# Patient Record
Sex: Male | Born: 1980 | Hispanic: Yes | Marital: Single | Smoking: Current some day smoker
Health system: Southern US, Community
[De-identification: ages and names within clinical notes are randomized; demographics above are authoritative.]

## PROBLEM LIST (undated history)

## (undated) DIAGNOSIS — I10 Essential (primary) hypertension: Secondary | ICD-10-CM

---

## 2014-10-03 ENCOUNTER — Encounter (HOSPITAL_COMMUNITY): Payer: Self-pay | Admitting: Emergency Medicine

## 2014-10-03 ENCOUNTER — Emergency Department (HOSPITAL_COMMUNITY): Payer: Worker's Compensation

## 2014-10-03 ENCOUNTER — Emergency Department (HOSPITAL_COMMUNITY)
Admission: EM | Admit: 2014-10-03 | Discharge: 2014-10-03 | Disposition: A | Discharge status: Home / Self Care | Payer: Worker's Compensation | Attending: Emergency Medicine | Admitting: Emergency Medicine

## 2014-10-03 DIAGNOSIS — W312XXA Contact with powered woodworking and forming machines, initial encounter: Secondary | ICD-10-CM | POA: Insufficient documentation

## 2014-10-03 DIAGNOSIS — S61210A Laceration without foreign body of right index finger without damage to nail, initial encounter: Secondary | ICD-10-CM | POA: Diagnosis not present

## 2014-10-03 DIAGNOSIS — Z79899 Other long term (current) drug therapy: Secondary | ICD-10-CM | POA: Insufficient documentation

## 2014-10-03 DIAGNOSIS — Y9389 Activity, other specified: Secondary | ICD-10-CM | POA: Insufficient documentation

## 2014-10-03 DIAGNOSIS — Y998 Other external cause status: Secondary | ICD-10-CM | POA: Diagnosis not present

## 2014-10-03 DIAGNOSIS — IMO0002 Reserved for concepts with insufficient information to code with codable children: Secondary | ICD-10-CM

## 2014-10-03 DIAGNOSIS — S6991XA Unspecified injury of right wrist, hand and finger(s), initial encounter: Secondary | ICD-10-CM | POA: Diagnosis present

## 2014-10-03 DIAGNOSIS — S62639B Displaced fracture of distal phalanx of unspecified finger, initial encounter for open fracture: Secondary | ICD-10-CM

## 2014-10-03 DIAGNOSIS — S62630B Displaced fracture of distal phalanx of right index finger, initial encounter for open fracture: Secondary | ICD-10-CM | POA: Insufficient documentation

## 2014-10-03 DIAGNOSIS — Y9289 Other specified places as the place of occurrence of the external cause: Secondary | ICD-10-CM | POA: Insufficient documentation

## 2014-10-03 MED ORDER — LIDOCAINE HCL 2 % IJ SOLN
20.0000 mL | Freq: Once | INTRAMUSCULAR | Status: AC
  Administered 2014-10-03: 400 mg
  Filled 2014-10-03: qty 20

## 2014-10-03 MED ORDER — CEFUROXIME SODIUM 1.5 G IJ SOLR
1.5000 g | Freq: Three times a day (TID) | INTRAMUSCULAR | Status: DC
Start: 2014-10-03 — End: 2014-10-03

## 2014-10-03 MED ORDER — CEFUROXIME SODIUM 1.5 G IJ SOLR
1.5000 g | Freq: Three times a day (TID) | INTRAMUSCULAR | Status: DC
  Filled 2014-10-03 (×5): qty 1.5

## 2014-10-03 MED ORDER — SULFAMETHOXAZOLE-TRIMETHOPRIM 800-160 MG PO TABS: 1.0000 | ORAL_TABLET | Freq: Two times a day (BID) | ORAL | Status: AC

## 2014-10-03 MED ORDER — OXYCODONE-ACETAMINOPHEN 5-325 MG PO TABS: 1.0000 | ORAL_TABLET | ORAL | Status: DC | PRN

## 2014-10-03 MED ORDER — CEFUROXIME SODIUM 750 MG IJ SOLR
1.5000 g | Freq: Three times a day (TID) | INTRAMUSCULAR | Status: DC
  Administered 2014-10-03: 1.5 g via INTRAMUSCULAR
  Filled 2014-10-03 (×3): qty 1500

## 2014-10-03 NOTE — ED Notes (Signed)
Called pharmacy again and asked for medication.

## 2014-10-03 NOTE — ED Notes (Signed)
Had spoken with pharmacy earlier and they were to send the antibiotic at that time.  Still have not received.  Messaged them for the medication.

## 2014-10-03 NOTE — Discharge Instructions (Signed)
Cuidados del yeso o la frula (Cast or Splint Care) El yeso y las frulas sostienen los miembros lesionados y evitan que los huesos se muevan hasta que se curen. Es importante que cuide el yeso o la frula cuando se encuentre en su casa.  INSTRUCCIONES PARA EL CUIDADO EN EL HOGAR  Mantenga el yeso o la frula al descubierto durante el tiempo de secado. Puede tardar Lyndal Pulley 24 y 2 horas para secarse si est hecho de yeso. La fibra de vidrio se seca en menos de 1 hora.  No apoye el yeso sobre nada que sea ms duro que una almohada durante 24 horas.  No aplique peso sobre el miembro lesionado ni haga presin sobre el yeso hasta que el mdico lo autorice.  Mantenga el yeso o la frula secos. Al mojarse pueden perder la forma y podra ocurrir que no soporten el [place]. Un yeso mojado que ha perdido su forma puede presionar de Geographical information systems officer peligrosa en la piel al secarse. Adems, la piel mojada podra infectarse.  Cubra el yeso o la frula con una bolsa plstica cuando tome un bao o cuando salga al exterior en das de lluvia o nieve. Si el yeso est colocado sobre el tronco, deber baarse pasando una esponja por el cuerpo, hasta que se lo retiren.  Si el yeso se moja, squelo con una toalla o con un secador de cabello slo en posicin de aire fro.  Mantenga el yeso o la frula limpios. Si el yeso se ensucia, puede limpiarlo con un pao hmedo.  No coloque objetos extraos duros o blandos debajo del yeso o cabestrillo, como algodn, papel higinico, locin o talco.  No se rasque la piel por debajo del molde con ningn objeto. Podra quedar adherido al yeso. Adems, el rascado puede causar una infeccin. Si siente picazn, use un secador de cabello con aire fro [employer] zona que pica para [employer].  No recorte ni quite el relleno acolchado que se encuentra debajo del yeso.  Ejercite todas las articulaciones que no estn inmovilizadas por el yeso o frula. Por ejemplo, si tiene un yeso  largo de pierna, ejercite la articulacin de la cadera y los dedos de los pies. Si tiene un brazo [employer] o entablillado, ejercite el hombro, el codo, el pulgar y los dedos de la [place].  Eleve el brazo o la pierna sobre 1  2 almohadas durante los primeros 3 das para disminuir la hinchazn y Conservation officer, historic buildings.Es mejor si puede elevar cmodamente el yeso para que quede ms [place] del nivel del corazn. SOLICITE ATENCIN MDICA SI:   El yeso o la frula se quiebran.  Siente que el yeso o la frula estn muy apretados o muy flojos.  Tiene una picazn insoportable debajo del yeso.  El yeso se moja o tiene una zona blanda.  Siente un feo [employer] proviene del interior del [place].  Algn objeto se queda atascado bajo el yeso.  La piel que rodea el yeso enrojece o se vuelve sensible.  Siente un dolor nuevo o el dolor que senta empeora luego de la aplicacin del yeso. SOLICITE ATENCIN MDICA DE INMEDIATO SI:   Observa un lquido que sale por el yeso.  No puede mover el dedo lesionado.  Los dedos le cambian de color (blancos o azules), siente fro, Social research officer, government o por fuera del yeso los dedos estn muy inflamados.  Siente hormigueo o adormecimiento alrededor de la zona de la lesin.  Siente un dolor o presin intensos debajo del yeso.  Presenta dificultad para respirar o Company secretaryle falta el aire.  Siente dolor en el pecho. Document Released: 03/14/2005 Document Revised: 01/02/2013 [facility] Patient Information 2015 [employer], [place]. This information is not intended to replace advice given to you by your health care provider. Make sure you discuss any questions you have with your health care provider.  Please read attached information, monitor for signs of infection. Return immediately if any present. Please follow-up with hand surgeon on Monday for further evaluation and management. Sutures need to be removed in 7 days.

## 2014-10-03 NOTE — ED Notes (Signed)
Patient states he cut his R index finger with a skill saw approximately 20 minutes ago.   Patient states he has had no loss of sensation, can move fingers as directed.   Bleeding controlled at this time.

## 2014-10-03 NOTE — ED Provider Notes (Signed)
CSN: [id number]     Arrival date & time 10/03/14  4098 History  This chart was scribed for Burna Forts, PA-C, working with Bethann Berkshire, MD by Elon Spanner, ED Scribe. This patient was seen in room TR06C/TR06C and the patient's care was started at 9:24 AM.   Chief Complaint  Patient presents with  . Finger Injury   The history is provided by the patient. No language interpreter was used.   HPI Comments: Jaser Fullen is a 34 y.o. male who presents to the Emergency Department complaining of a right hand pointer finger injury onset 1 hour ago.  The patient's coworker is acting as a Nurse, learning disability at his request and reports the patient was using a skill saw at work, when it rebounded and cut his finger.  Patient denies history of DM.  Patient has no surgical history; last meal 7:30 am today.  Tetanus UTD.  NKA. Full active range of motion of the finger no other injuries noted.   History reviewed. No pertinent past medical history. History reviewed. No pertinent past surgical history. No family history on file. History  Substance Use Topics  . Smoking status: Never Smoker   . Smokeless tobacco: Not on file  . Alcohol Use: Yes    Review of Systems  All other systems reviewed and are negative.   Allergies  Review of patient's allergies indicates no known allergies.  Home Medications   Prior to Admission medications   Medication Sig Start Date End Date Taking? Authorizing Provider  lisinopril (PRINIVIL,ZESTRIL) 20 MG tablet Take 20 mg by mouth daily.   Yes Historical Provider, MD  oxyCODONE-acetaminophen (PERCOCET/ROXICET) 5-325 MG per tablet Take 1 tablet by mouth every 4 (four) hours as needed for severe pain. 10/03/14   Eyvonne Mechanic, PA-C  sulfamethoxazole-trimethoprim (BACTRIM DS,SEPTRA DS) 800-160 MG per tablet Take 1 tablet by mouth 2 (two) times daily. 10/03/14 10/10/14  Treesa Mccully, PA-C   BP 136/81 mmHg  Pulse 80  Temp(Src) 98.4 F (36.9 C) (Oral)  Resp 16  SpO2 100%    Physical Exam  Constitutional: He is oriented to person, place, and time. He appears well-developed and well-nourished. No distress.  HENT:  Head: Normocephalic and atraumatic.  Eyes: Conjunctivae and EOM are normal.  Neck: Neck supple. No tracheal deviation present.  Cardiovascular: Normal rate.   Pulmonary/Chest: Effort normal. No respiratory distress.  Musculoskeletal: Normal range of motion.  Linear laceration approximately a centimeter long to the distal phalanx on the right second digit. No observable bone or tendon on physical exam no foreign bodies noted bleeding controlled. Resisted flexion of the DIP PIP intact cap refill less than 3 seconds sensation intact  Neurological: He is alert and oriented to person, place, and time.  Skin: Skin is warm and dry.  Psychiatric: He has a normal mood and affect. His behavior is normal.  Nursing note and vitals reviewed.   ED Course  Procedures (including critical care time)  DIAGNOSTIC STUDIES: Oxygen Saturation is 96% on RA, normal by my interpretation.    COORDINATION OF CARE:  9:50 AM Discussed treatment plan with patient at bedside.  Patient acknowledges and agrees with plan.    LACERATION REPAIR PROCEDURE NOTE The patient's identification was confirmed and consent was obtained. This procedure was performed by Burna Forts, PA-C at 11:44 AM. Site: Right volar aspect second digit Sterile procedures observed yes Anesthetic used (type and amt): 2% lidocaine Suture type/size: 5-0 Prolene Length: 1 cm # of Sutures: 3 Technique: Simple interrupted Complexity simple  Antibx ointment applied acid trace and Tetanus UTD or ordered up-to-date Site anesthetized, irrigated with copious NS, explored without evidence of foreign body, wound well approximated, site covered with dry, sterile dressing.  Patient tolerated procedure well without complications. Instructions for care discussed verbally and patient provided with additional  written instructions for homecare and f/u.  Labs Review Labs Reviewed - No data to display  Imaging Review Dg Finger Index Right  10/03/2014   CLINICAL DATA:  Skil saw laceration today  EXAM: RIGHT INDEX FINGER 2+V  COMPARISON:  None.  FINDINGS: Three views of the right second finger submitted. There is small avulsion fracture distal phalanx second finger with associated soft tissue injury. Open fracture cannot be excluded. Clinical correlation is necessary.  IMPRESSION: Small avulsion fracture distal phalanx right second finger with associated soft tissue injury. Open fracture cannot be excluded.   Electronically Signed   By: Natasha MeadLiviu  Pop M.D.   On: 10/03/2014 09:35     EKG Interpretation None      MDM   Final diagnoses:  Laceration  Fracture of distal phalanx of finger, open, initial encounter    Labs:  Imaging: DG Finger Index Right- I personally reviewed the films and agree with the radiologist  interpretation of distal phalanx right second finger avulsion fracture  Therapeutics: 2% Lidocaine; Cefuroxime  Consult: 10:46 AM Spoke with Dr. Ronie SpiesWeingold's nurse.  Assessment/Plan:  Patient presents with a fracture of the distal phalanx, laceration to the finger, this likely represents open fracture although the bone was not visible on my physical exam. Dr. Mina MarbleWeingold personally called me, I informed him of the open fracture, he instructed me to suture together have him follow-up in the office Monday morning. Patient placed on prophylactic antibiotics, given strict return precautions. Patient verbalizes understanding and agreement. He was instructed to have sutures removed in 7 days, he assured us follow-up with Dr. Mina MarbleWeingold. Patient was treated here in the ED with IM antibiotics listed above, suture was closed.  Discharge Meds: Bactrim, Percocet  I personally performed the services described in this documentation, which was scribed in my presence. The recorded information has been reviewed  and is accurate.    Eyvonne MechanicJeffrey Jalena Vanderlinden, PA-C 10/03/14 1719  Bethann BerkshireJoseph Zammit, MD 10/04/14 [phone]

## 2016-09-26 ENCOUNTER — Emergency Department (HOSPITAL_COMMUNITY): Payer: Self-pay

## 2016-09-26 ENCOUNTER — Encounter (HOSPITAL_COMMUNITY): Payer: Self-pay | Admitting: Emergency Medicine

## 2016-09-26 DIAGNOSIS — R1032 Left lower quadrant pain: Secondary | ICD-10-CM | POA: Insufficient documentation

## 2016-09-26 DIAGNOSIS — E860 Dehydration: Secondary | ICD-10-CM | POA: Insufficient documentation

## 2016-09-26 DIAGNOSIS — I10 Essential (primary) hypertension: Secondary | ICD-10-CM | POA: Insufficient documentation

## 2016-09-26 DIAGNOSIS — Z79899 Other long term (current) drug therapy: Secondary | ICD-10-CM | POA: Insufficient documentation

## 2016-09-26 DIAGNOSIS — N179 Acute kidney failure, unspecified: Principal | ICD-10-CM | POA: Insufficient documentation

## 2016-09-26 DIAGNOSIS — M109 Gout, unspecified: Secondary | ICD-10-CM | POA: Insufficient documentation

## 2016-09-26 DIAGNOSIS — M546 Pain in thoracic spine: Secondary | ICD-10-CM | POA: Insufficient documentation

## 2016-09-26 LAB — BASIC METABOLIC PANEL
Anion gap: 13 (ref 5–15)
BUN: 20 mg/dL (ref 6–20)
CHLORIDE: 103 mmol/L (ref 101–111)
CO2: 23 mmol/L (ref 22–32)
Calcium: 9.1 mg/dL (ref 8.9–10.3)
Creatinine, Ser: 1.73 mg/dL — ABNORMAL HIGH (ref 0.61–1.24)
GFR calc Af Amer: 57 mL/min — ABNORMAL LOW (ref >60)
GFR calc non Af Amer: 49 mL/min — ABNORMAL LOW (ref >60)
GLUCOSE: 106 mg/dL — AB (ref 65–99)
POTASSIUM: 4.2 mmol/L (ref 3.5–5.1)
Sodium: 139 mmol/L (ref 135–145)

## 2016-09-26 LAB — CBC
HEMATOCRIT: 43.3 % (ref 39.0–52.0)
Hemoglobin: 14.3 g/dL (ref 13.0–17.0)
MCH: 30.5 pg (ref 26.0–34.0)
MCHC: 33 g/dL (ref 30.0–36.0)
MCV: 92.3 fL (ref 78.0–100.0)
Platelets: 198 10*3/uL (ref 150–400)
RBC: 4.69 MIL/uL (ref 4.22–5.81)
RDW: 13.6 % (ref 11.5–15.5)
WBC: 8 10*3/uL (ref 4.0–10.5)

## 2016-09-26 LAB — I-STAT TROPONIN, ED: Troponin i, poc: 0 ng/mL (ref 0.00–0.08)

## 2016-09-26 LAB — LIPASE, BLOOD: LIPASE: 30 U/L (ref 11–51)

## 2016-09-26 NOTE — ED Triage Notes (Signed)
Pt. presents with multiple complaints: mid back pain onset this afternoon with upper abdominal ain  And left lower chest pain , denies SOB , no nausea or diaphoresis . Pain increases when sitting .

## 2016-09-27 ENCOUNTER — Encounter (HOSPITAL_COMMUNITY): Payer: Self-pay | Admitting: Internal Medicine

## 2016-09-27 ENCOUNTER — Observation Stay (HOSPITAL_COMMUNITY)
Admission: EM | Admit: 2016-09-27 | Discharge: 2016-09-28 | Disposition: A | Discharge status: Home / Self Care | Payer: Self-pay | Attending: Family Medicine | Admitting: Family Medicine

## 2016-09-27 ENCOUNTER — Emergency Department (HOSPITAL_COMMUNITY): Payer: Self-pay

## 2016-09-27 DIAGNOSIS — M109 Gout, unspecified: Secondary | ICD-10-CM | POA: Diagnosis not present

## 2016-09-27 DIAGNOSIS — R109 Unspecified abdominal pain: Secondary | ICD-10-CM | POA: Diagnosis present

## 2016-09-27 DIAGNOSIS — N179 Acute kidney failure, unspecified: Secondary | ICD-10-CM | POA: Diagnosis present

## 2016-09-27 DIAGNOSIS — Z8739 Personal history of other diseases of the musculoskeletal system and connective tissue: Secondary | ICD-10-CM

## 2016-09-27 DIAGNOSIS — I10 Essential (primary) hypertension: Secondary | ICD-10-CM | POA: Diagnosis present

## 2016-09-27 HISTORY — DX: Essential (primary) hypertension: I10

## 2016-09-27 LAB — URINALYSIS, ROUTINE W REFLEX MICROSCOPIC
BILIRUBIN URINE: NEGATIVE
Glucose, UA: NEGATIVE mg/dL
Ketones, ur: NEGATIVE mg/dL
LEUKOCYTES UA: NEGATIVE
Nitrite: NEGATIVE
PH: 5 (ref 5.0–8.0)
Protein, ur: 30 mg/dL — AB
SPECIFIC GRAVITY, URINE: 1.005 (ref 1.005–1.030)

## 2016-09-27 LAB — HEPATIC FUNCTION PANEL
ALT: 38 U/L (ref 17–63)
AST: 32 U/L (ref 15–41)
Albumin: 3.8 g/dL (ref 3.5–5.0)
Alkaline Phosphatase: 83 U/L (ref 38–126)
BILIRUBIN DIRECT: 0.1 mg/dL (ref 0.1–0.5)
BILIRUBIN INDIRECT: 0.7 mg/dL (ref 0.3–0.9)
TOTAL PROTEIN: 7.3 g/dL (ref 6.5–8.1)
Total Bilirubin: 0.8 mg/dL (ref 0.3–1.2)

## 2016-09-27 LAB — RAPID URINE DRUG SCREEN, HOSP PERFORMED
Amphetamines: NOT DETECTED
BARBITURATES: NOT DETECTED
Benzodiazepines: NOT DETECTED
Cocaine: NOT DETECTED
Opiates: NOT DETECTED
Tetrahydrocannabinol: NOT DETECTED

## 2016-09-27 LAB — BASIC METABOLIC PANEL
Anion gap: 9 (ref 5–15)
BUN: 20 mg/dL (ref 6–20)
CALCIUM: 9.4 mg/dL (ref 8.9–10.3)
CO2: 25 mmol/L (ref 22–32)
Chloride: 103 mmol/L (ref 101–111)
Creatinine, Ser: 1.82 mg/dL — ABNORMAL HIGH (ref 0.61–1.24)
GFR calc Af Amer: 54 mL/min — ABNORMAL LOW (ref >60)
GFR, EST NON AFRICAN AMERICAN: 46 mL/min — AB (ref >60)
GLUCOSE: 101 mg/dL — AB (ref 65–99)
Potassium: 3.8 mmol/L (ref 3.5–5.1)
Sodium: 137 mmol/L (ref 135–145)

## 2016-09-27 LAB — POCT I-STAT TROPONIN I: Troponin i, poc: 0 ng/mL (ref 0.00–0.08)

## 2016-09-27 LAB — HIV ANTIBODY (ROUTINE TESTING W REFLEX): HIV SCREEN 4TH GENERATION: NONREACTIVE

## 2016-09-27 MED ORDER — ONDANSETRON HCL 4 MG/2ML IJ SOLN: 4.0000 mg | Freq: Four times a day (QID) | INTRAMUSCULAR | Status: DC | PRN

## 2016-09-27 MED ORDER — ACETAMINOPHEN 325 MG PO TABS: 650.0000 mg | ORAL_TABLET | Freq: Four times a day (QID) | ORAL | Status: DC | PRN

## 2016-09-27 MED ORDER — DEXTROSE 5 % IV SOLN: 1.0000 g | INTRAVENOUS | Status: DC

## 2016-09-27 MED ORDER — ONDANSETRON HCL 4 MG PO TABS: 4.0000 mg | ORAL_TABLET | Freq: Four times a day (QID) | ORAL | Status: DC | PRN

## 2016-09-27 MED ORDER — ACETAMINOPHEN 650 MG RE SUPP: 650.0000 mg | Freq: Four times a day (QID) | RECTAL | Status: DC | PRN

## 2016-09-27 MED ORDER — SODIUM CHLORIDE 0.9 % IV SOLN
Freq: Once | INTRAVENOUS | Status: AC
  Administered 2016-09-27: 05:00:00 via INTRAVENOUS

## 2016-09-27 MED ORDER — SODIUM CHLORIDE 0.9 % IV BOLUS (SEPSIS)
1000.0000 mL | Freq: Once | INTRAVENOUS | Status: AC
  Administered 2016-09-27: 1000 mL via INTRAVENOUS

## 2016-09-27 MED ORDER — ONDANSETRON HCL 4 MG/2ML IJ SOLN
4.0000 mg | Freq: Once | INTRAMUSCULAR | Status: AC
  Administered 2016-09-27: 4 mg via INTRAVENOUS
  Filled 2016-09-27: qty 2

## 2016-09-27 MED ORDER — SODIUM CHLORIDE 0.9 % IV SOLN
INTRAVENOUS | Status: AC
  Administered 2016-09-27 (×2): via INTRAVENOUS

## 2016-09-27 MED ORDER — AMLODIPINE BESYLATE 5 MG PO TABS
5.0000 mg | ORAL_TABLET | Freq: Every day | ORAL | Status: DC
  Administered 2016-09-27 – 2016-09-28 (×2): 5 mg via ORAL
  Filled 2016-09-27 (×2): qty 1

## 2016-09-27 MED ORDER — HYDRALAZINE HCL 20 MG/ML IJ SOLN: 10.0000 mg | INTRAMUSCULAR | Status: DC | PRN

## 2016-09-27 MED ORDER — DEXTROSE 5 % IV SOLN
1.0000 g | Freq: Once | INTRAVENOUS | Status: AC
  Administered 2016-09-27: 1 g via INTRAVENOUS
  Filled 2016-09-27: qty 10

## 2016-09-27 MED ORDER — MORPHINE SULFATE (PF) 4 MG/ML IV SOLN
4.0000 mg | Freq: Once | INTRAVENOUS | Status: AC
  Administered 2016-09-27: 4 mg via INTRAVENOUS
  Filled 2016-09-27: qty 1

## 2016-09-27 MED ORDER — MORPHINE SULFATE (PF) 4 MG/ML IV SOLN
1.0000 mg | INTRAVENOUS | Status: DC | PRN
  Administered 2016-09-27 – 2016-09-28 (×3): 1 mg via INTRAVENOUS
  Filled 2016-09-27 (×3): qty 1

## 2016-09-27 NOTE — Progress Notes (Signed)
New Admission Note:   Arrival Method: Bed Mental Orientation: A&O X4 Telemetry: Initiated Assessment: Completed Skin: WDL IV: Clean, Dry, Intact, Infusin Pain: Denies Safety Measures: Safety Fall Prevention Plan has been given, discussed and signed Admission: Completed Unit Orientation: Patient has been orientated to the room, unit and staff.  Family: At bedside  Orders have been reviewed and implemented. Will continue to monitor the patient. Call light has been placed within reach and bed alarm has been activated.    Britt BologneseAnisha Mabe RN, BSN

## 2016-09-27 NOTE — H&P (Signed)
History and Physical    Walter Murray JXB:[id number] DOB: 1980/05/24 DOA: 09/27/2016  PCP: Patient, No Pcp Per  Patient coming from: Home.  Engineer, structural used.  Chief Complaint: Left flank pain.  HPI: Dymond Spreen is a 36 y.o. male with history of hypertension, gout presents to the ER with sudden onset of left flank pain. Patient's symptoms started last evening. Denies any nausea vomiting or diarrhea. Pain was from the left flank going to his left groin. Pain was severe in nature.   ED Course: In the ER patient's UA shows some bacteria. Creatinine was 1.7. CT renal study shows no obstruction or renal stones but shows bilateral perinephric stranding more on the left side. Since patient still has pain patient has been admitted for further observation and hydration and was placed on antibiotics for possible pyelonephritis.  Review of Systems: As per HPI, rest all negative.   Past Medical History:  Diagnosis Date  . Hypertension     History reviewed. No pertinent surgical history.   reports that he has been smoking.  He has never used smokeless tobacco. He reports that he drinks alcohol. He reports that he does not use drugs.  No Known Allergies  Family History  Problem Relation Age of Onset  . Diabetes Mellitus II Mother     Prior to Admission medications   Medication Sig Start Date End Date Taking? Authorizing Provider  losartan (COZAAR) 100 MG tablet Take 100 mg by mouth daily.   Yes [provider]  oxyCODONE-acetaminophen (PERCOCET/ROXICET) 5-325 MG per tablet Take 1 tablet by mouth every 4 (four) hours as needed for severe pain. Patient not taking: Reported on 09/27/2016 10/03/14   Eyvonne Mechanic, PA-C    Physical Exam: Vitals:   09/27/16 0315 09/27/16 0400 09/27/16 0415 09/27/16 0430  BP: (!) 144/91 (!) 150/96 (!) 156/93 (!) 160/91  Pulse: 73 70 87 89  Resp: (!) 23 (!) 27 (!) 21 (!) 22  Temp:      TempSrc:      SpO2: 100% 98% 99% 100%       Constitutional: Moderately built and nourished. Vitals:   09/27/16 0315 09/27/16 0400 09/27/16 0415 09/27/16 0430  BP: (!) 144/91 (!) 150/96 (!) 156/93 (!) 160/91  Pulse: 73 70 87 89  Resp: (!) 23 (!) 27 (!) 21 (!) 22  Temp:      TempSrc:      SpO2: 100% 98% 99% 100%   Eyes: Anicteric no pallor. ENMT: No discharge from the ears eyes nose and mouth. Neck: No mass felt. No neck rigidity. Respiratory: No rhonchi or crepitations. Cardiovascular: S1 and S2 heard no murmurs appreciated. Abdomen: Soft nontender bowel sounds present. Musculoskeletal: No edema. No joint effusion. Skin: No rash. Skin appears warm. Neurologic: Alert awake oriented to time place and person. Moves all extremities. Psychiatric: Appears normal. Normal affect.   Labs on Admission: I have personally reviewed following labs and imaging studies  CBC:  Recent Labs Lab 09/26/16 2223  WBC 8.0  HGB 14.3  HCT 43.3  MCV 92.3  PLT 198   Basic Metabolic Panel:  Recent Labs Lab 09/26/16 2223  NA 139  K 4.2  CL 103  CO2 23  GLUCOSE 106*  BUN 20  CREATININE 1.73*  CALCIUM 9.1   GFR: CrCl cannot be calculated (Unknown ideal weight.). Liver Function Tests: No results for input(s): AST, ALT, ALKPHOS, BILITOT, PROT, ALBUMIN in the last 168 hours.  Recent Labs Lab 09/26/16 2223  LIPASE 30  No results for input(s): AMMONIA in the last 168 hours. Coagulation Profile: No results for input(s): INR, PROTIME in the last 168 hours. Cardiac Enzymes: No results for input(s): CKTOTAL, CKMB, CKMBINDEX, TROPONINI in the last 168 hours. BNP (last 3 results) No results for input(s): PROBNP in the last 8760 hours. HbA1C: No results for input(s): HGBA1C in the last 72 hours. CBG: No results for input(s): GLUCAP in the last 168 hours. Lipid Profile: No results for input(s): CHOL, HDL, LDLCALC, TRIG, CHOLHDL, LDLDIRECT in the last 72 hours. Thyroid Function Tests: No results for input(s): TSH, T4TOTAL,  FREET4, T3FREE, THYROIDAB in the last 72 hours. Anemia Panel: No results for input(s): VITAMINB12, FOLATE, FERRITIN, TIBC, IRON, RETICCTPCT in the last 72 hours. Urine analysis:    Component Value Date/Time   COLORURINE STRAW (A) 09/27/2016 0214   APPEARANCEUR CLEAR 09/27/2016 0214   LABSPEC 1.005 09/27/2016 0214   PHURINE 5.0 09/27/2016 0214   GLUCOSEU NEGATIVE 09/27/2016 0214   HGBUR SMALL (A) 09/27/2016 0214   BILIRUBINUR NEGATIVE 09/27/2016 0214   KETONESUR NEGATIVE 09/27/2016 0214   PROTEINUR 30 (A) 09/27/2016 0214   NITRITE NEGATIVE 09/27/2016 0214   LEUKOCYTESUR NEGATIVE 09/27/2016 0214   Sepsis Labs: @LABRCNTIP (procalcitonin:4,lacticidven:4) )No results found for this or any previous visit (from the past 240 hour(s)).   Radiological Exams on Admission: Dg Chest 2 View  Result Date: 09/26/2016 CLINICAL DATA:  Mid back pain onset this afternoon with left lower chest pain. EXAM: CHEST  2 VIEW COMPARISON:  None. FINDINGS: The heart size and mediastinal contours are within normal limits. Lungs are clear. Low lung volumes noted bilaterally. No pneumothorax, pulmonary consolidation or effusion. No acute nor suspicious osseous abnormalities. IMPRESSION: No active cardiopulmonary disease. Electronically Signed   By: Tollie Ethavid  Kwon M.D.   On: 09/26/2016 23:47   Ct Renal Stone Study  Result Date: 09/27/2016 CLINICAL DATA:  Left flank pain for 2 days. EXAM: CT ABDOMEN AND PELVIS WITHOUT CONTRAST TECHNIQUE: Multidetector CT imaging of the abdomen and pelvis was performed following the standard protocol without IV contrast. COMPARISON:  None. FINDINGS: Lower chest: Eventration of right hemidiaphragm. No consolidation. No pleural effusion. Hepatobiliary: The liver is enlarged spanning 23 cm cranial caudal diffusely decreased density. No evidence of focal hepatic lesion. Gallbladder minimally distended, no calcified stone. No biliary dilatation. Pancreas: No ductal dilatation or inflammation. Spleen:  Normal in size without focal abnormality. Adrenals/Urinary Tract: Normal adrenal glands. Moderate bilateral perinephric edema, left greater than right. No frank hydronephrosis or urolithiasis. Urinary bladder is minimally distended and not well evaluated. No bladder stones. Stomach/Bowel: Stomach is decompressed. No small bowel obstruction or inflammation. Appendix not confidently visualized. No pericecal or right lower quadrant inflammation. Small to moderate stool burden without colonic wall thickening. Vascular/Lymphatic: Mild aortic atherosclerosis. No aneurysm. No adenopathy. Reproductive: Prostate is unremarkable. Other: No free air, free fluid, or intra-abdominal fluid collection. Fat in the left inguinal canal. Small fat containing umbilical hernia. 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 There are no acute or suspicious osseous abnormalities. IMPRESSION: 1. No renal stones or obstructive uropathy. 2. Moderate bilateral perinephric edema, left greater than right. This is nonspecific, can be seen in the setting of urinary tract infection, acute or chronic renal disease. 3. Hepatomegaly and hepatic steatosis. 4. Mild aortic atherosclerosis. Electronically Signed   By: Rubye OaksMelanie  Ehinger M.D.   On: 09/27/2016 02:49    EKG: Independently reviewed. Normal sinus rhythm.  Assessment/Plan Principal Problem:   ARF (acute renal failure) (HCC) Active Problems:   Essential hypertension  Left flank pain   History of gout    1. Acute renal failure with left flank pain - no old labs to compare. Among the differentials include possible pyelonephritis. Patient has been placed on ceftriaxone and follow urine cultures continue with hydration and hold Cozaar. Follow intake output metabolic panel. 2. Hypertension - since patient has possible acute renal failure will hold off Cozaar and keep patient on when necessary IV hydralazine. Follow metabolic panel.  3. History of GERD presently no signs of  exacerbation. 4. Tobacco abuse - tobacco cessation counseling requested.   DVT prophylaxis: SCDs. Code Status: Full code.  Family Communication: Discussed with patient.  Disposition Plan: Home.  Consults called: None.  Admission status: Observation.    Eduard Clos MD [facility] Pager [phone].  If 7PM-7AM, please contact night-coverage [email or web address] Password [email or web address]  09/27/2016, 5:48 AM

## 2016-09-27 NOTE — H&P (Signed)
PROGRESS NOTE    Walter Murray  ZOX:[id number] DOB: 03/15/1981 DOA: 09/27/2016     Brief Narrative:    36 year old male with past medical history of hypertension on Cozaar was working in the hot weather presents with bilateral flank pain .  Assessment & Plan:   1-Acute renal failure : most likely ATN in the setting of taking ARB and dehydration , Patient is still making urine we are going to continue to hydrate him with normal saline and follow-up with BMP in the morning , avoid  nephrotoxic agents , continue to monitor him as an inpatient as his creatinine is still peaking around 1.8 , we expect the creatinine to start to trend down soon , strict I/O for now . Patient has no SIRS criteria and no UTI symptoms his urinalysis is not supportive of any UTI for these reasons I will stop the antibiotics , most likely his CAT scan findings are related to an acute renal failure.  2-Essential hypertension : patient can be started on Norvasc 5 mg daily .  DVT prophylaxis:SCD  Code Status: FULL .  Disposition Plan: Home in 24 hours.  Consultants:   None .    Antimicrobials:  None.   Subjective:  Mr. Sheerin is feeling better , flank pain has resolved,  he is denying any nausea or vomiting fevers or chills any UTI symptoms .  Objective: Vitals:   09/27/16 0430 09/27/16 0515 09/27/16 0615 09/27/16 0630  BP: (!) 160/91 (!) 143/91 135/79 121/76  Pulse: 89 78 71 70  Resp: (!) 22 (!) 23 (!) 24 (!) 23  Temp:      TempSrc:      SpO2: 100% 98% 98% 97%    Intake/Output Summary (Last 24 hours) at 09/27/16 1011 Last data filed at 09/27/16 0951  Gross per 24 hour  Intake             1530 ml  Output                0 ml  Net             1530 ml   There were no vitals filed for this visit.  Examination:  General exam: NAD. Respiratory system: Lungs were clear to auscultation bilaterally Cardiovascular system: Regular rate S1-S2 no murmurs. Gastrointestinal system: Abdomen is soft  nontender non-distended no rebound or rigidity. Central nervous system: Nonfocal  Extremities: Symmetric 5 x 5 power. Skin: No rashes, lesions or ulcers Psychiatry: Judgement and insight appear normal. Mood & affect appropriate.     Data Reviewed: I have personally reviewed following labs and imaging studies  CBC:  Recent Labs Lab 09/26/16 2223  WBC 8.0  HGB 14.3  HCT 43.3  MCV 92.3  PLT 198   Basic Metabolic Panel:  Recent Labs Lab 09/26/16 2223 09/27/16 0718  NA 139 137  K 4.2 3.8  CL 103 103  CO2 23 25  GLUCOSE 106* 101*  BUN 20 20  CREATININE 1.73* 1.82*  CALCIUM 9.1 9.4   GFR: CrCl cannot be calculated (Unknown ideal weight.). Liver Function Tests:  Recent Labs Lab 09/27/16 0718  AST 32  ALT 38  ALKPHOS 83  BILITOT 0.8  PROT 7.3  ALBUMIN 3.8    Recent Labs Lab 09/26/16 2223  LIPASE 30   No results for input(s): AMMONIA in the last 168 hours. Coagulation Profile: No results for input(s): INR, PROTIME in the last 168 hours. Cardiac Enzymes: No results for input(s): CKTOTAL, CKMB, CKMBINDEX, TROPONINI  in the last 168 hours. BNP (last 3 results) No results for input(s): PROBNP in the last 8760 hours. HbA1C: No results for input(s): HGBA1C in the last 72 hours. CBG: No results for input(s): GLUCAP in the last 168 hours. Lipid Profile: No results for input(s): CHOL, HDL, LDLCALC, TRIG, CHOLHDL, LDLDIRECT in the last 72 hours. Thyroid Function Tests: No results for input(s): TSH, T4TOTAL, FREET4, T3FREE, THYROIDAB in the last 72 hours. Anemia Panel: No results for input(s): VITAMINB12, FOLATE, FERRITIN, TIBC, IRON, RETICCTPCT in the last 72 hours. Urine analysis:    Component Value Date/Time   COLORURINE STRAW (A) 09/27/2016 0214   APPEARANCEUR CLEAR 09/27/2016 0214   LABSPEC 1.005 09/27/2016 0214   PHURINE 5.0 09/27/2016 0214   GLUCOSEU NEGATIVE 09/27/2016 0214   HGBUR SMALL (A) 09/27/2016 0214   BILIRUBINUR NEGATIVE 09/27/2016 0214    KETONESUR NEGATIVE 09/27/2016 0214   PROTEINUR 30 (A) 09/27/2016 0214   NITRITE NEGATIVE 09/27/2016 0214   LEUKOCYTESUR NEGATIVE 09/27/2016 0214   Sepsis Labs: @LABRCNTIP (procalcitonin:4,lacticidven:4)  )No results found for this or any previous visit (from the past 240 hour(s)).       Radiology Studies: Dg Chest 2 View  Result Date: 09/26/2016 CLINICAL DATA:  Mid back pain onset this afternoon with left lower chest pain. EXAM: CHEST  2 VIEW COMPARISON:  None. FINDINGS: The heart size and mediastinal contours are within normal limits. Lungs are clear. Low lung volumes noted bilaterally. No pneumothorax, pulmonary consolidation or effusion. No acute nor suspicious osseous abnormalities. IMPRESSION: No active cardiopulmonary disease. Electronically Signed   By: Tollie Ethavid  Kwon M.D.   On: 09/26/2016 23:47   Ct Renal Stone Study  Result Date: 09/27/2016 CLINICAL DATA:  Left flank pain for 2 days. EXAM: CT ABDOMEN AND PELVIS WITHOUT CONTRAST TECHNIQUE: Multidetector CT imaging of the abdomen and pelvis was performed following the standard protocol without IV contrast. COMPARISON:  None. FINDINGS: Lower chest: Eventration of right hemidiaphragm. No consolidation. No pleural effusion. Hepatobiliary: The liver is enlarged spanning 23 cm cranial caudal diffusely decreased density. No evidence of focal hepatic lesion. Gallbladder minimally distended, no calcified stone. No biliary dilatation. Pancreas: No ductal dilatation or inflammation. Spleen: Normal in size without focal abnormality. Adrenals/Urinary Tract: Normal adrenal glands. Moderate bilateral perinephric edema, left greater than right. No frank hydronephrosis or urolithiasis. Urinary bladder is minimally distended and not well evaluated. No bladder stones. Stomach/Bowel: Stomach is decompressed. No small bowel obstruction or inflammation. Appendix not confidently visualized. No pericecal or right lower quadrant inflammation. Small to moderate stool  burden without colonic wall thickening. Vascular/Lymphatic: Mild aortic atherosclerosis. No aneurysm. No adenopathy. Reproductive: Prostate is unremarkable. Other: No free air, free fluid, or intra-abdominal fluid collection. Fat in the left inguinal canal. Small fat containing umbilical hernia. 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 There are no acute or suspicious osseous abnormalities. IMPRESSION: 1. No renal stones or obstructive uropathy. 2. Moderate bilateral perinephric edema, left greater than right. This is nonspecific, can be seen in the setting of urinary tract infection, acute or chronic renal disease. 3. Hepatomegaly and hepatic steatosis. 4. Mild aortic atherosclerosis. Electronically Signed   By: Rubye OaksMelanie  Ehinger M.D.   On: 09/27/2016 02:49        Scheduled Meds: Continuous Infusions: . sodium chloride    . [START ON 09/28/2016] cefTRIAXone (ROCEPHIN)  IV       LOS: 0 days    Time spent: more than 30 minutes.    Efrain SellaEiad Janisse Ghan, MD [facility] Pager [phone]  If  7PM-7AM, please contact night-coverage [email or web address] Password [email or web address] 09/27/2016, 10:11 AM

## 2016-09-27 NOTE — ED Notes (Signed)
Patient transported to CT 

## 2016-09-27 NOTE — Progress Notes (Signed)
Pharmacy Antibiotic Note Pearlean BrownieJulian Yoshimura is a 36 y.o. male admitted on 09/27/2016 with UTI.  Pharmacy has been consulted for ceftriaxone dosing.  Plan: 1. Ceftriaxone 1 gram Iv every 24 hours  2. Will follow peripherally      Temp (24hrs), Avg:98.4 F (36.9 C), Min:98.4 F (36.9 C), Max:98.4 F (36.9 C)   Recent Labs Lab 09/26/16 2223  WBC 8.0  CREATININE 1.73*    CrCl cannot be calculated (Unknown ideal weight.).    No Known Allergies  Thank you for allowing pharmacy to be a part of this patient's care.  Sheron NightingaleJames A Soren Lazarz 09/27/2016 6:03 AM

## 2016-09-27 NOTE — ED Provider Notes (Signed)
[facility]-EMERGENCY DEPT Provider Note   CSN: [id number] Arrival date & time: 09/26/16  2200     History   Chief Complaint Chief Complaint  Patient presents with  . Back Pain  . Chest Pain  . Abdominal Pain    HPI Walter Murray is a 36 y.o. male.  L flank pain for 2 days started in back and radiated to LLQ without nausea, constipation- last normal BM 2 PM yesterday   Denies dysuria, penile discharge Dose drink heavily on weekends      Past Medical History:  Diagnosis Date  . Hypertension     There are no active problems to display for this patient.   History reviewed. No pertinent surgical history.     Home Medications    Prior to Admission medications   Medication Sig Start Date End Date Taking? Authorizing Provider  lisinopril (PRINIVIL,ZESTRIL) 20 MG tablet Take 20 mg by mouth daily.    [provider]  oxyCODONE-acetaminophen (PERCOCET/ROXICET) 5-325 MG per tablet Take 1 tablet by mouth every 4 (four) hours as needed for severe pain. 10/03/14   Eyvonne MechanicHedges, Jeffrey, PA-C    Family History No family history on file.  Social History Social History  Substance Use Topics  . Smoking status: Current Some Day Smoker  . Smokeless tobacco: Never Used  . Alcohol use Yes     Allergies   Patient has no known allergies.   Review of Systems Review of Systems   Physical Exam Updated Vital Signs BP (!) 155/90 (BP Location: Left Arm)   Pulse 75   Temp 98.4 F (36.9 C) (Oral)   Resp 20   SpO2 99%   Physical Exam  Constitutional: He appears well-developed and well-nourished.  HENT:  Head: Normocephalic.  Eyes: Pupils are equal, round, and reactive to light.  Neck: Normal range of motion.  Cardiovascular: Normal rate.   Pulmonary/Chest: Effort normal.  Abdominal: He exhibits no distension. There is no hepatosplenomegaly. There is tenderness in the epigastric area and left upper quadrant. There is no rebound and no guarding.  Musculoskeletal: Normal  range of motion.  Neurological: He is alert.  Skin: Skin is warm.  Nursing note and vitals reviewed.    ED Treatments / Results  Labs (all labs ordered are listed, but only abnormal results are displayed) Labs Reviewed  BASIC METABOLIC PANEL - Abnormal; Notable for the following:       Result Value   Glucose, Bld 106 (*)    Creatinine, Ser 1.73 (*)    GFR calc non Af Amer 49 (*)    GFR calc Af Amer 57 (*)    All other components within normal limits  CBC  LIPASE, BLOOD  URINALYSIS, ROUTINE W REFLEX MICROSCOPIC  I-STAT TROPOININ, ED    EKG  EKG Interpretation None       Radiology Dg Chest 2 View  Result Date: 09/26/2016 CLINICAL DATA:  Mid back pain onset this afternoon with left lower chest pain. EXAM: CHEST  2 VIEW COMPARISON:  None. FINDINGS: The heart size and mediastinal contours are within normal limits. Lungs are clear. Low lung volumes noted bilaterally. No pneumothorax, pulmonary consolidation or effusion. No acute nor suspicious osseous abnormalities. IMPRESSION: No active cardiopulmonary disease. Electronically Signed   By: Tollie Ethavid  Kwon M.D.   On: 09/26/2016 23:47    Procedures Procedures (including critical care time)  Medications Ordered in ED Medications - No data to display   Initial Impression / Assessment and Plan / ED Course  I  have reviewed the triage vital signs and the nursing notes.  Pertinent labs & imaging results that were available during my care of the patient were reviewed by me and considered in my medical decision making (see chart for details).        Final Clinical Impressions(s) / ED Diagnoses   Final diagnoses:  None    New Prescriptions New Prescriptions   No medications on file     Earley Favor, NP 09/27/16 0229    Ward, Layla Maw, DO 09/27/16 0330

## 2016-09-27 NOTE — Progress Notes (Signed)
   09/27/16 0800  Clinical Encounter Type  Visited With Patient and family together;Health care provider  Visit Type Initial;Spiritual support  Referral From Nurse  Consult/Referral To Chaplain  Spiritual Encounters  Spiritual Needs Literature;Brochure;Prayer;Emotional  Stress Factors  Patient Stress Factors Health changes;Lack of knowledge  Family Stress Factors Health changes;Lack of knowledge  Advance Directives (For Healthcare)  Does Patient Have a Medical Advance Directive? No  Would patient like information on creating a medical advance directive? Yes (Inpatient - patient requests chaplain consult to create a medical advance directive)  Mental Health Advance Directives  Does Patient Have a Mental Health Advance Directive? No  Would patient like information on creating a mental health advance directive? No - Patient declined    Chaplain [facility]. Provided eduction and literature re:AD. Patient could benefit from interpretive services, but declined. Provided ministry of presence, emotional support, and prayer. Patient will ask for chaplain when and if decide to complete document. Lin Glazier L. Salomon FickBanks, MDiv

## 2016-09-27 NOTE — ED Notes (Signed)
ED Provider at bedside. 

## 2016-09-27 NOTE — Care Management Note (Signed)
Case Management Note  Patient Details  Name: Walter Murray MRN: [id number] Date of Birth: June 26, 1980  Subjective/Objective:                 Spoke w patient at the bedside. He provide [facility]. Patient uses [facility] for prescriptions.  [facility] [place]   Action/Plan:   Expected Discharge Date:                  Expected Discharge Plan:  Home/Self Care  In-House Referral:     Discharge planning Services  CM Consult  Post Acute Care Choice:    Choice offered to:     DME Arranged:    DME Agency:     HH Arranged:    HH Agency:     Status of Service:  In process, will continue to follow  If discussed at Long Length of Stay Meetings, dates discussed:    Additional Comments:  Lawerance SabalDebbie Absalom Aro, RN 09/27/2016, 2:47 PM

## 2016-09-27 NOTE — Progress Notes (Signed)
Interpreter Graciela Namihira for patient °

## 2016-09-28 DIAGNOSIS — I10 Essential (primary) hypertension: Secondary | ICD-10-CM

## 2016-09-28 DIAGNOSIS — R109 Unspecified abdominal pain: Secondary | ICD-10-CM

## 2016-09-28 DIAGNOSIS — M109 Gout, unspecified: Secondary | ICD-10-CM

## 2016-09-28 DIAGNOSIS — N179 Acute kidney failure, unspecified: Secondary | ICD-10-CM

## 2016-09-28 LAB — BASIC METABOLIC PANEL
Anion gap: 8 (ref 5–15)
BUN: 17 mg/dL (ref 6–20)
CALCIUM: 9 mg/dL (ref 8.9–10.3)
CHLORIDE: 102 mmol/L (ref 101–111)
CO2: 27 mmol/L (ref 22–32)
CREATININE: 1.67 mg/dL — AB (ref 0.61–1.24)
GFR calc Af Amer: 59 mL/min — ABNORMAL LOW (ref >60)
GFR calc non Af Amer: 51 mL/min — ABNORMAL LOW (ref >60)
GLUCOSE: 124 mg/dL — AB (ref 65–99)
Potassium: 3.9 mmol/L (ref 3.5–5.1)
Sodium: 137 mmol/L (ref 135–145)

## 2016-09-28 LAB — URINE CULTURE: Culture: NO GROWTH

## 2016-09-28 MED ORDER — AMLODIPINE BESYLATE 5 MG PO TABS: 5.0000 mg | ORAL_TABLET | Freq: Every day | ORAL | 0 refills | Status: AC

## 2016-09-28 MED ORDER — COLCHICINE 0.6 MG PO TABS
1.2000 mg | ORAL_TABLET | Freq: Once | ORAL | Status: AC
Start: 2016-09-28 — End: 2016-09-28
  Administered 2016-09-28: 1.2 mg via ORAL
  Filled 2016-09-28: qty 2

## 2016-09-28 MED ORDER — PREDNISONE 10 MG PO TABS
30.0000 mg | ORAL_TABLET | Freq: Every day | ORAL | 0 refills | Status: AC
Start: 2016-09-29 — End: 2016-10-04

## 2016-09-28 MED ORDER — OXYCODONE-ACETAMINOPHEN 5-325 MG PO TABS: 1.0000 | ORAL_TABLET | Freq: Four times a day (QID) | ORAL | 0 refills | Status: AC | PRN

## 2016-09-28 MED ORDER — METHYLPREDNISOLONE SODIUM SUCC 125 MG IJ SOLR
125.0000 mg | Freq: Once | INTRAMUSCULAR | Status: AC
Start: 2016-09-28 — End: 2016-09-28
  Administered 2016-09-28: 125 mg via INTRAVENOUS
  Filled 2016-09-28: qty 2

## 2016-09-28 NOTE — Progress Notes (Signed)
Patient Discharge: Disposition: Patient discharged to home. Education: Reviewed medications, prescriptions, follow-up appointments and discharge instructions with the help of patient's cousin who speaks english fluently. Patient didn't want the interpreter.  Patient was able to verbalize understanding and had no questions or concerns regarding the discharge. IV: Discontinued IV before discharge. Telemetry: Discontinued Tele before discharge, CCMD notified. Transportation: Patient escorted out of the unit in w/c. Belongings: Patient took all his belongings with him.

## 2016-09-28 NOTE — Discharge Summary (Signed)
Physician Discharge Summary  [name]:[id number] DOB: May 17, 1980 DOA: 09/27/2016  PCP: Walter Murray, No Pcp Per  Admit date: 09/27/2016 Discharge date: 09/28/2016  Admitted From: Home  Disposition: Home  Recommendations for Outpatient Follow-up:  1. Follow up with PCP in 1 weeks 2. Please obtain BMP/CBC in one week  Discharge Condition: Stable   CODE STATUS: Full    Brief Hospitalization Summary: Please see all hospital notes, images, labs for full details of the hospitalization. left flank pain. Walter Murray's symptoms started last evening. Denies any nausea vomiting or diarrhea. Pain was from the left flank going to his left groin. Pain was severe in nature.   ED Course: In the ER Walter Murray's UA shows some bacteria. Creatinine was 1.7. CT renal study shows no obstruction or renal stones but shows bilateral perinephric stranding more on the left side. Since Walter Murray still has pain Walter Murray has been admitted for further observation and hydration and was placed on antibiotics for possible pyelonephritis.  36 year old male with past medical history of hypertension on Cozaar was working in the hot weather presents with bilateral flank pain.  Assessment & Plan:  1-Acute renal failure : most likely ATN in the setting of taking ARB and dehydration, Walter Murray was hydrated with normal saline and follow-up with BMP with improvement noted Cr trending down, 1.67 prior to discharge, recheck in 1 week with PCP, avoid  nephrotoxic agents, He has good urine output and expect renal function to continue to improve daily.   Walter Murray has no SIRS criteria and no UTI symptoms his urinalysis is not supportive of any UTI for these reasons stopped the antibiotics , most likely his CAT scan findings are related to an acute renal failure.  2-Essential hypertension : Walter Murray started on Norvasc 5 mg daily .  Further management outpatient with PCP.   Losartan was discontinued.   3. Acute gout - exacerbated by dehydration  and acute renal failure - treated with steroids in hospital and will discharge him home on prednisone for 5 more days.  Encouraged oral fluids and rest.    DVT prophylaxis:SCD  Code Status: FULL   Disposition Plan: Home in 24 hours.  Discharge Diagnoses:  Principal Problem:   ARF (acute renal failure) (HCC) Active Problems:   Essential hypertension   Left flank pain   History of gout   Acute gout  Discharge Instructions: Discharge Instructions    Increase activity slowly    Complete by:  As directed      Allergies as of 09/28/2016   No Known Allergies     Medication List    STOP taking these medications   losartan 100 MG tablet Commonly known as:  COZAAR     TAKE these medications   amLODipine 5 MG tablet Commonly known as:  NORVASC Take 1 tablet (5 mg total) by mouth daily. Start taking on:  09/29/2016   oxyCODONE-acetaminophen 5-325 MG tablet Commonly known as:  PERCOCET/ROXICET Take 1 tablet by mouth every 6 (six) hours as needed for severe pain. What changed:  when to take this   predniSONE 10 MG tablet Commonly known as:  DELTASONE Take 3 tablets (30 mg total) by mouth daily with breakfast. Start taking on:  09/29/2016      Follow-up Information    [facility] Call.   Why:  Call to schedule appoint with our PCP Contact information: [address], [place], [place] [address] [phone]         No Known Allergies Current Discharge Medication List  START taking these medications   Details  amLODipine (NORVASC) 5 MG tablet Take 1 tablet (5 mg total) by mouth daily. Qty: 30 tablet, Refills: 0    predniSONE (DELTASONE) 10 MG tablet Take 3 tablets (30 mg total) by mouth daily with breakfast. Qty: 15 tablet, Refills: 0      CONTINUE these medications which have CHANGED   Details  oxyCODONE-acetaminophen (PERCOCET/ROXICET) 5-325 MG tablet Take 1 tablet by mouth every 6 (six) hours as needed for severe pain. Qty: 12 tablet, Refills: 0       STOP taking these medications     losartan (COZAAR) 100 MG tablet         Procedures/Studies: Dg Chest 2 View  Result Date: 09/26/2016 CLINICAL DATA:  Mid back pain onset this afternoon with left lower chest pain. EXAM: CHEST  2 VIEW COMPARISON:  None. FINDINGS: The heart size and mediastinal contours are within normal limits. Lungs are clear. Low lung volumes noted bilaterally. No pneumothorax, pulmonary consolidation or effusion. No acute nor suspicious osseous abnormalities. IMPRESSION: No active cardiopulmonary disease. Electronically Signed   By: Tollie Ethavid  Kwon M.D.   On: 09/26/2016 23:47   Ct Renal Stone Study  Result Date: 09/27/2016 CLINICAL DATA:  Left flank pain for 2 days. EXAM: CT ABDOMEN AND PELVIS WITHOUT CONTRAST TECHNIQUE: Multidetector CT imaging of the abdomen and pelvis was performed following the standard protocol without IV contrast. COMPARISON:  None. FINDINGS: Lower chest: Eventration of right hemidiaphragm. No consolidation. No pleural effusion. Hepatobiliary: The liver is enlarged spanning 23 cm cranial caudal diffusely decreased density. No evidence of focal hepatic lesion. Gallbladder minimally distended, no calcified stone. No biliary dilatation. Pancreas: No ductal dilatation or inflammation. Spleen: Normal in size without focal abnormality. Adrenals/Urinary Tract: Normal adrenal glands. Moderate bilateral perinephric edema, left greater than right. No frank hydronephrosis or urolithiasis. Urinary bladder is minimally distended and not well evaluated. No bladder stones. Stomach/Bowel: Stomach is decompressed. No small bowel obstruction or inflammation. Appendix not confidently visualized. No pericecal or right lower quadrant inflammation. Small to moderate stool burden without colonic wall thickening. Vascular/Lymphatic: Mild aortic atherosclerosis. No aneurysm. No adenopathy. Reproductive: Prostate is unremarkable. Other: No free air, free fluid, or intra-abdominal fluid  collection. Fat in the left inguinal canal. Small fat containing umbilical hernia. 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 There are no acute or suspicious osseous abnormalities. IMPRESSION: 1. No renal stones or obstructive uropathy. 2. Moderate bilateral perinephric edema, left greater than right. This is nonspecific, can be seen in the setting of urinary tract infection, acute or chronic renal disease. 3. Hepatomegaly and hepatic steatosis. 4. Mild aortic atherosclerosis. Electronically Signed   By: Rubye OaksMelanie  Ehinger M.D.   On: 09/27/2016 02:49      Subjective: Pt overall feeling better but having an exacerbation of gout in right elbow and left foot.   Discharge Exam: Vitals:   09/28/16 0445 09/28/16 1038  BP: 120/76 (!) 148/88  Pulse: 74 85  Resp: 17 17  Temp: 98.5 F (36.9 C) 98.8 F (37.1 C)   Vitals:   09/27/16 1241 09/27/16 2120 09/28/16 0445 09/28/16 1038  BP: (!) 144/90 (!) 146/99 120/76 (!) 148/88  Pulse:  84 74 85  Resp:  18 17 17   Temp:  98.6 F (37 C) 98.5 F (36.9 C) 98.8 F (37.1 C)  TempSrc:  Oral Oral Oral  SpO2:  100% 99% 100%  Weight:   104.4 kg (230 lb 1.6 oz)   Height:   5\' 6"  (1.676 m)  General: Pt is alert, awake, not in acute distress Cardiovascular: RRR, S1/S2 +, no rubs, no gallops Respiratory: CTA bilaterally, no wheezing, no rhonchi Abdominal: Soft, NT, ND, bowel sounds + Extremities: no edema, no cyanosis   The results of significant diagnostics from this hospitalization (including imaging, microbiology, ancillary and laboratory) are listed below for reference.     Microbiology: Recent Results (from the past 240 hour(s))  Urine culture     Status: None   Collection Time: 09/26/16  2:16 AM  Result Value Ref Range Status   Specimen Description URINE, RANDOM  Final   Special Requests NONE  Final   Culture NO GROWTH  Final   Report Status 09/28/2016 FINAL  Final     Labs: BNP (last 3 results) No results for input(s): BNP in  the last 8760 hours. Basic Metabolic Panel:  Recent Labs Lab 09/26/16 2223 09/27/16 0718 09/28/16 0431  NA 139 137 137  K 4.2 3.8 3.9  CL 103 103 102  CO2 23 25 27   GLUCOSE 106* 101* 124*  BUN 20 20 17   CREATININE 1.73* 1.82* 1.67*  CALCIUM 9.1 9.4 9.0   Liver Function Tests:  Recent Labs Lab 09/27/16 0718  AST 32  ALT 38  ALKPHOS 83  BILITOT 0.8  PROT 7.3  ALBUMIN 3.8    Recent Labs Lab 09/26/16 2223  LIPASE 30   No results for input(s): AMMONIA in the last 168 hours. CBC:  Recent Labs Lab 09/26/16 2223  WBC 8.0  HGB 14.3  HCT 43.3  MCV 92.3  PLT 198   Cardiac Enzymes: No results for input(s): CKTOTAL, CKMB, CKMBINDEX, TROPONINI in the last 168 hours. BNP: Invalid input(s): POCBNP CBG: No results for input(s): GLUCAP in the last 168 hours. D-Dimer No results for input(s): DDIMER in the last 72 hours. Hgb A1c No results for input(s): HGBA1C in the last 72 hours. Lipid Profile No results for input(s): CHOL, HDL, LDLCALC, TRIG, CHOLHDL, LDLDIRECT in the last 72 hours. Thyroid function studies No results for input(s): TSH, T4TOTAL, T3FREE, THYROIDAB in the last 72 hours.  Invalid input(s): FREET3 Anemia work up No results for input(s): VITAMINB12, FOLATE, FERRITIN, TIBC, IRON, RETICCTPCT in the last 72 hours. Urinalysis    Component Value Date/Time   COLORURINE STRAW (A) 09/27/2016 0214   APPEARANCEUR CLEAR 09/27/2016 0214   LABSPEC 1.005 09/27/2016 0214   PHURINE 5.0 09/27/2016 0214   GLUCOSEU NEGATIVE 09/27/2016 0214   HGBUR SMALL (A) 09/27/2016 0214   BILIRUBINUR NEGATIVE 09/27/2016 0214   KETONESUR NEGATIVE 09/27/2016 0214   PROTEINUR 30 (A) 09/27/2016 0214   NITRITE NEGATIVE 09/27/2016 0214   LEUKOCYTESUR NEGATIVE 09/27/2016 0214   Sepsis Labs Invalid input(s): PROCALCITONIN,  WBC,  LACTICIDVEN Microbiology Recent Results (from the past 240 hour(s))  Urine culture     Status: None   Collection Time: 09/26/16  2:16 AM  Result  Value Ref Range Status   Specimen Description URINE, RANDOM  Final   Special Requests NONE  Final   Culture NO GROWTH  Final   Report Status 09/28/2016 FINAL  Final   Time coordinating discharge: 33 mins  SIGNED:  Standley Dakins, MD  [facility] 09/28/2016, 12:42 PM Pager [phone]  If 7PM-7AM, please contact night-coverage [email or web address] Password [email or web address]

## 2018-10-31 IMAGING — CT CT RENAL STONE PROTOCOL
2 of 4 series · 16 of 46 positions shown, 18 images · non-contrast
Comparison: None.

CLINICAL DATA: Left flank pain for 2 days.

EXAM:
CT ABDOMEN AND PELVIS WITHOUT CONTRAST
TECHNIQUE: Multidetector CT imaging of the abdomen and pelvis was performed
following the standard protocol without IV contrast.

[Series 3: ap without · axial · non-contrast · 0.79mm/px · z∈[+967,+1437]mm · 13 of 108 slices shown, 15 images]
[im 7/108  soft-tissue]
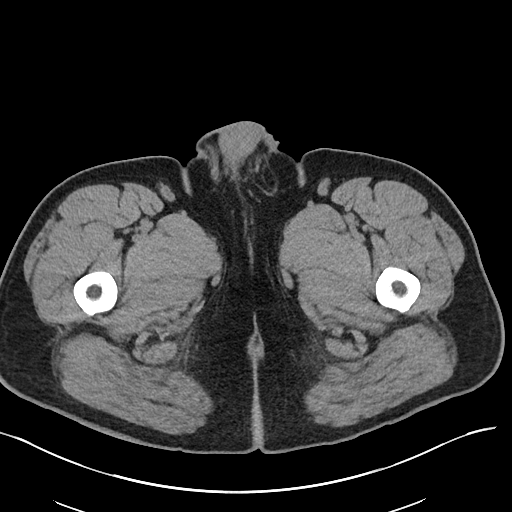
[im 7/108  bone]
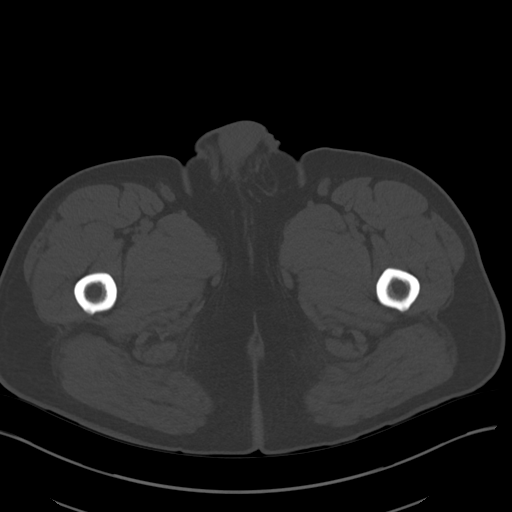
[im 13/108  soft-tissue]
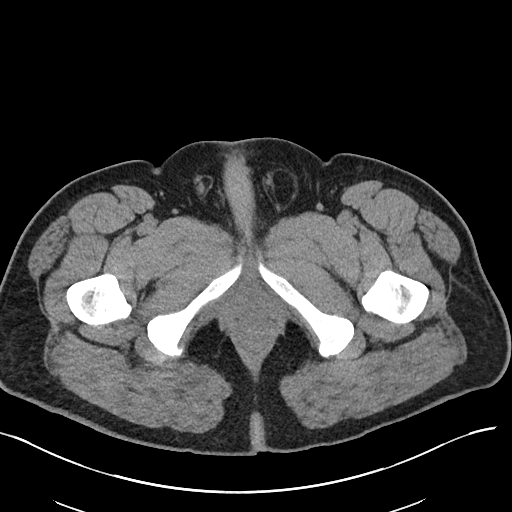
[im 26/108  soft-tissue]
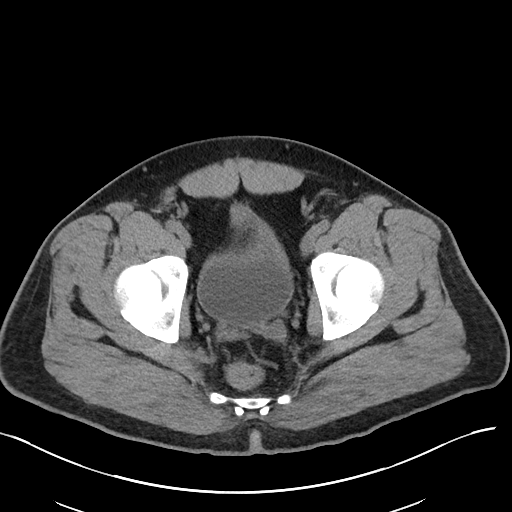
[im 32/108  soft-tissue]
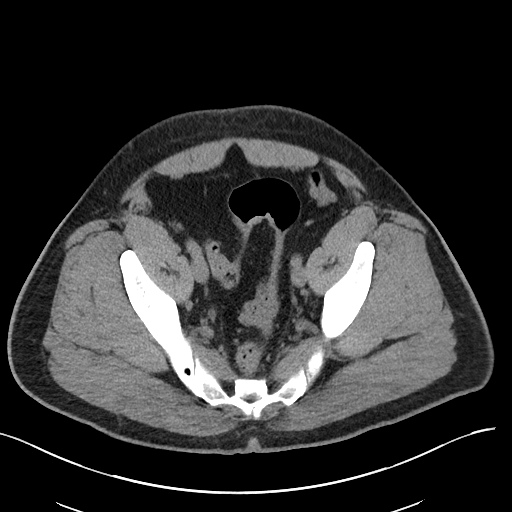
[im 38/108  soft-tissue]
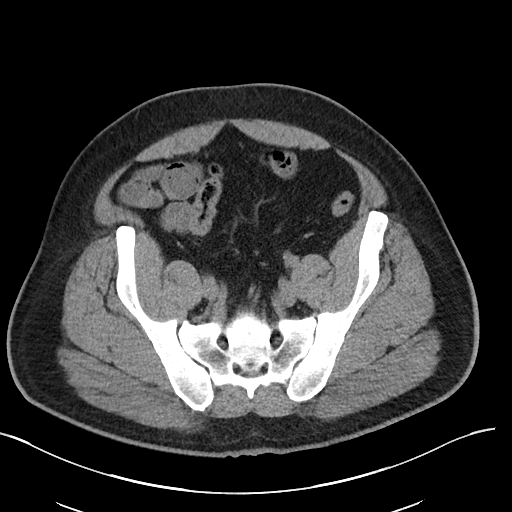
[im 45/108  soft-tissue]
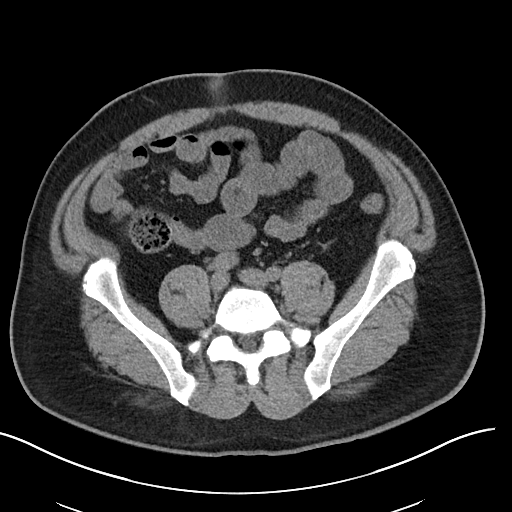
[im 57/108  soft-tissue]
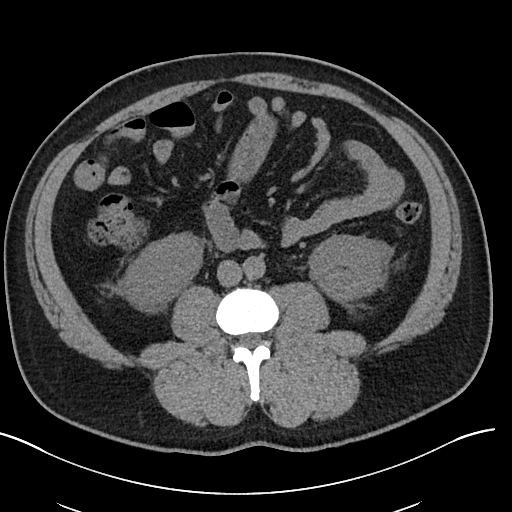
[im 63/108  soft-tissue]
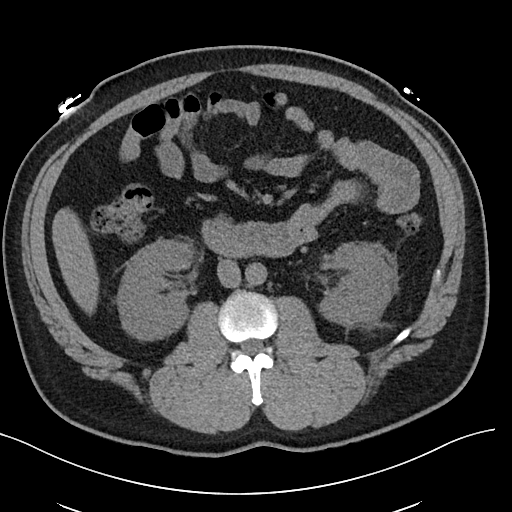
[im 70/108  soft-tissue]
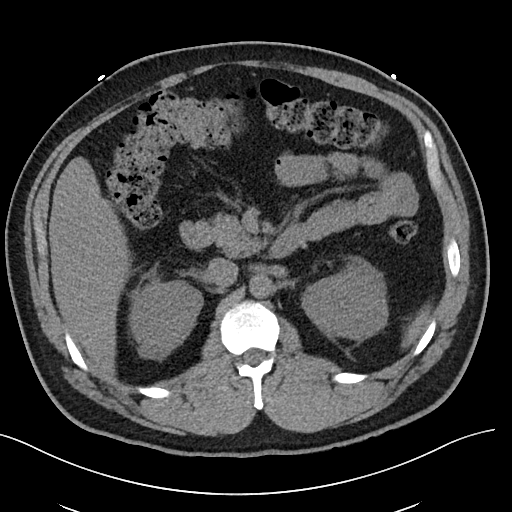
[im 70/108  bone]
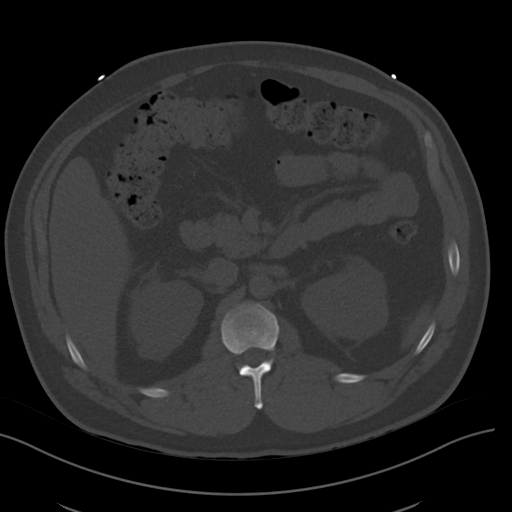
[im 76/108  soft-tissue]
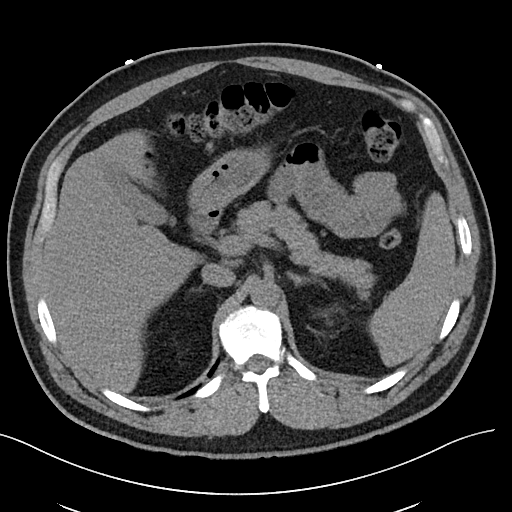
[im 82/108  soft-tissue]
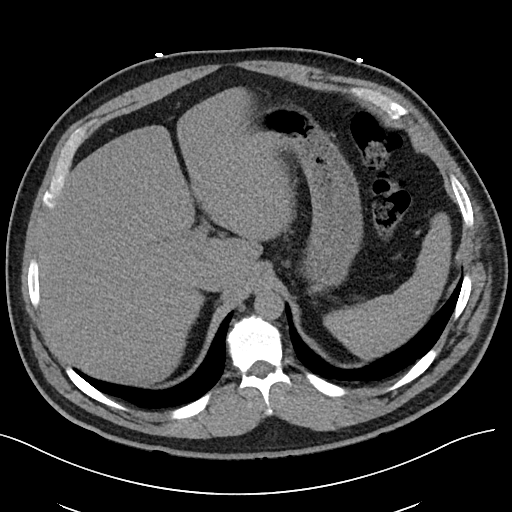
[im 95/108  soft-tissue]
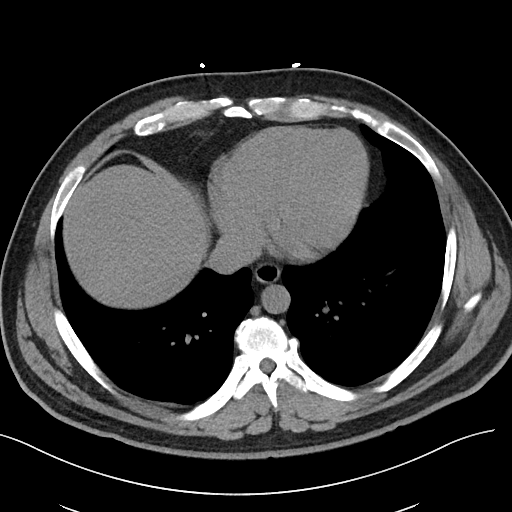
[im 101/108  soft-tissue]
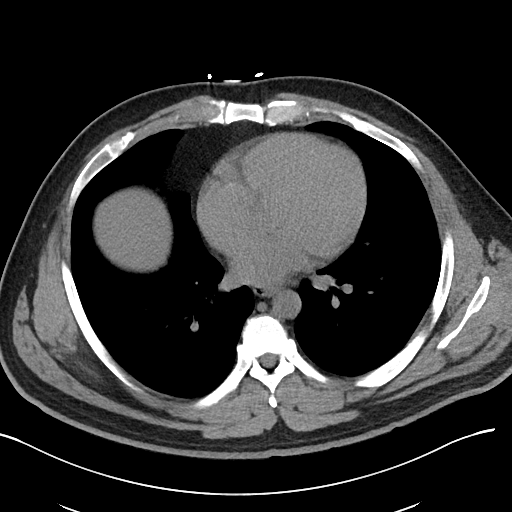

[Series 6: cor · coronal · 0.97mm/px · 3 of 110 slices shown]
[im 37/110  soft-tissue]
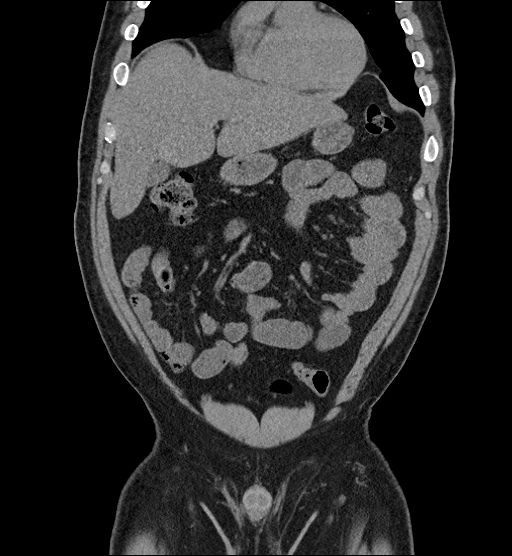
[im 49/110  soft-tissue]
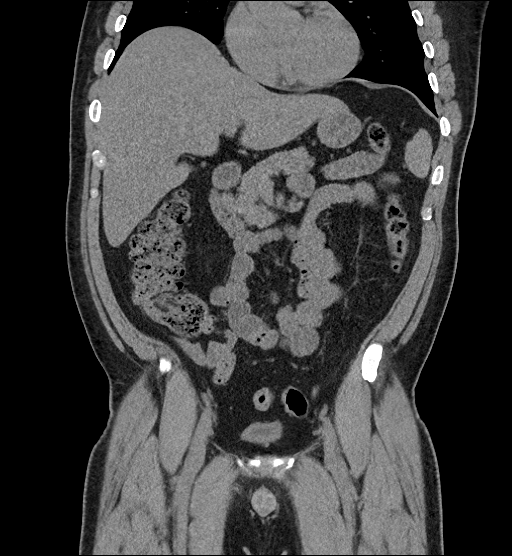
[im 61/110  soft-tissue]
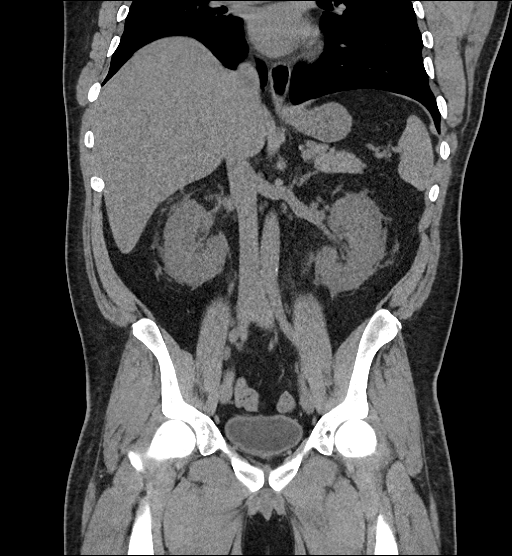

[16 of 46 positions shown; findings below may reference images not displayed]

FINDINGS: Lower chest: Eventration of right hemidiaphragm. No consolidation.
No pleural effusion.

Hepatobiliary: The liver is enlarged spanning 23 cm cranial caudal
diffusely decreased density. No evidence of focal hepatic lesion.
Gallbladder minimally distended, no calcified stone. No biliary
dilatation.

Pancreas: No ductal dilatation or inflammation.

Spleen: Normal in size without focal abnormality.

Adrenals/Urinary Tract: Normal adrenal glands. Moderate bilateral
perinephric edema, left greater than right. No frank hydronephrosis
or urolithiasis. Urinary bladder is minimally distended and not well
evaluated. No bladder stones.

Stomach/Bowel: Stomach is decompressed. No small bowel obstruction
or inflammation. Appendix not confidently visualized. No pericecal
or right lower quadrant inflammation. Small to moderate stool burden
without colonic wall thickening.

Vascular/Lymphatic: Mild aortic atherosclerosis. No aneurysm. No
adenopathy.

Reproductive: Prostate is unremarkable.

Other: No free air, free fluid, or intra-abdominal fluid collection.
Fat in the left inguinal canal. Small fat containing umbilical
hernia.

Musculoskeletal: Degenerative disc disease at L4-L5. There are no
acute or suspicious osseous abnormalities.
IMPRESSION: 1. No renal stones or obstructive uropathy.
2. Moderate bilateral perinephric edema, left greater than right.
This is nonspecific, can be seen in the setting of urinary tract
infection, acute or chronic renal disease.
3. Hepatomegaly and hepatic steatosis.
4. Mild aortic atherosclerosis.
